# Patient Record
Sex: Female | Born: 1935
Health system: Southern US, Community
[De-identification: ages and names within clinical notes are randomized; demographics above are authoritative.]

## PROBLEM LIST (undated history)

## (undated) DIAGNOSIS — Z7901 Long term (current) use of anticoagulants: Secondary | ICD-10-CM

## (undated) DIAGNOSIS — F0390 Unspecified dementia without behavioral disturbance: Secondary | ICD-10-CM

## (undated) DIAGNOSIS — I10 Essential (primary) hypertension: Secondary | ICD-10-CM

## (undated) DIAGNOSIS — R251 Tremor, unspecified: Secondary | ICD-10-CM

## (undated) DIAGNOSIS — I2699 Other pulmonary embolism without acute cor pulmonale: Secondary | ICD-10-CM

---

## 2014-12-15 ENCOUNTER — Encounter (HOSPITAL_COMMUNITY): Payer: Self-pay | Admitting: Internal Medicine

## 2014-12-15 ENCOUNTER — Inpatient Hospital Stay (HOSPITAL_COMMUNITY)
Admission: AD | Admit: 2014-12-15 | Discharge: 2014-12-19 | DRG: 378 | Disposition: A | Discharge status: Home Health | Payer: Medicare Other | Source: Other Acute Inpatient Hospital | Attending: Family Medicine | Admitting: Family Medicine

## 2014-12-15 DIAGNOSIS — I10 Essential (primary) hypertension: Secondary | ICD-10-CM | POA: Diagnosis present

## 2014-12-15 DIAGNOSIS — Z4659 Encounter for fitting and adjustment of other gastrointestinal appliance and device: Secondary | ICD-10-CM

## 2014-12-15 DIAGNOSIS — I2699 Other pulmonary embolism without acute cor pulmonale: Secondary | ICD-10-CM | POA: Diagnosis present

## 2014-12-15 DIAGNOSIS — Z79899 Other long term (current) drug therapy: Secondary | ICD-10-CM

## 2014-12-15 DIAGNOSIS — F0390 Unspecified dementia without behavioral disturbance: Secondary | ICD-10-CM | POA: Diagnosis present

## 2014-12-15 DIAGNOSIS — Z86711 Personal history of pulmonary embolism: Secondary | ICD-10-CM

## 2014-12-15 DIAGNOSIS — R251 Tremor, unspecified: Secondary | ICD-10-CM | POA: Diagnosis present

## 2014-12-15 DIAGNOSIS — D649 Anemia, unspecified: Secondary | ICD-10-CM | POA: Diagnosis present

## 2014-12-15 DIAGNOSIS — I350 Nonrheumatic aortic (valve) stenosis: Secondary | ICD-10-CM | POA: Diagnosis present

## 2014-12-15 DIAGNOSIS — K648 Other hemorrhoids: Secondary | ICD-10-CM | POA: Diagnosis present

## 2014-12-15 DIAGNOSIS — K5731 Diverticulosis of large intestine without perforation or abscess with bleeding: Principal | ICD-10-CM | POA: Diagnosis present

## 2014-12-15 DIAGNOSIS — Z7901 Long term (current) use of anticoagulants: Secondary | ICD-10-CM | POA: Diagnosis not present

## 2014-12-15 DIAGNOSIS — F05 Delirium due to known physiological condition: Secondary | ICD-10-CM | POA: Insufficient documentation

## 2014-12-15 DIAGNOSIS — F0391 Unspecified dementia with behavioral disturbance: Secondary | ICD-10-CM | POA: Diagnosis not present

## 2014-12-15 DIAGNOSIS — D120 Benign neoplasm of cecum: Secondary | ICD-10-CM | POA: Diagnosis present

## 2014-12-15 DIAGNOSIS — K625 Hemorrhage of anus and rectum: Secondary | ICD-10-CM | POA: Diagnosis present

## 2014-12-15 HISTORY — DX: Tremor, unspecified: R25.1

## 2014-12-15 HISTORY — DX: Essential (primary) hypertension: I10

## 2014-12-15 HISTORY — DX: Long term (current) use of anticoagulants: Z79.01

## 2014-12-15 HISTORY — DX: Unspecified dementia, unspecified severity, without behavioral disturbance, psychotic disturbance, mood disturbance, and anxiety: F03.90

## 2014-12-15 HISTORY — DX: Other pulmonary embolism without acute cor pulmonale: I26.99

## 2014-12-15 LAB — COMPREHENSIVE METABOLIC PANEL
ALBUMIN: 3.2 g/dL — AB (ref 3.5–5.0)
ALT: 15 U/L (ref 14–54)
AST: 19 U/L (ref 15–41)
Alkaline Phosphatase: 53 U/L (ref 38–126)
Anion gap: 5 (ref 5–15)
BUN: 12 mg/dL (ref 6–20)
CHLORIDE: 108 mmol/L (ref 101–111)
CO2: 29 mmol/L (ref 22–32)
Calcium: 8.1 mg/dL — ABNORMAL LOW (ref 8.9–10.3)
Creatinine, Ser: 0.56 mg/dL (ref 0.44–1.00)
GFR calc Af Amer: >60 mL/min (ref >60)
GFR calc non Af Amer: >60 mL/min (ref >60)
GLUCOSE: 104 mg/dL — AB (ref 65–99)
POTASSIUM: 3.7 mmol/L (ref 3.5–5.1)
SODIUM: 142 mmol/L (ref 135–145)
Total Bilirubin: 0.5 mg/dL (ref 0.3–1.2)
Total Protein: 5.7 g/dL — ABNORMAL LOW (ref 6.5–8.1)

## 2014-12-15 LAB — CBC WITH DIFFERENTIAL/PLATELET
BASOS ABS: 0 10*3/uL (ref 0.0–0.1)
BASOS PCT: 1 % (ref 0–1)
EOS PCT: 2 % (ref 0–5)
Eosinophils Absolute: 0.1 10*3/uL (ref 0.0–0.7)
HCT: 27.2 % — ABNORMAL LOW (ref 36.0–46.0)
Hemoglobin: 8.6 g/dL — ABNORMAL LOW (ref 12.0–15.0)
Lymphocytes Relative: 20 % (ref 12–46)
Lymphs Abs: 1.4 10*3/uL (ref 0.7–4.0)
MCH: 30.7 pg (ref 26.0–34.0)
MCHC: 31.6 g/dL (ref 30.0–36.0)
MCV: 97.1 fL (ref 78.0–100.0)
MONO ABS: 0.6 10*3/uL (ref 0.1–1.0)
Monocytes Relative: 8 % (ref 3–12)
Neutro Abs: 4.9 10*3/uL (ref 1.7–7.7)
Neutrophils Relative %: 70 % (ref 43–77)
PLATELETS: 104 10*3/uL — AB (ref 150–400)
RBC: 2.8 MIL/uL — AB (ref 3.87–5.11)
RDW: 14.5 % (ref 11.5–15.5)
WBC: 7.1 10*3/uL (ref 4.0–10.5)

## 2014-12-15 LAB — RETICULOCYTES
RBC.: 2.8 MIL/uL — AB (ref 3.87–5.11)
RETIC COUNT ABSOLUTE: 11.2 10*3/uL — AB (ref 19.0–186.0)
RETIC CT PCT: 0.4 % (ref 0.4–3.1)

## 2014-12-15 LAB — HEMOGLOBIN AND HEMATOCRIT, BLOOD
HCT: 28 % — ABNORMAL LOW (ref 36.0–46.0)
HEMATOCRIT: 27.5 % — AB (ref 36.0–46.0)
Hemoglobin: 9 g/dL — ABNORMAL LOW (ref 12.0–15.0)
Hemoglobin: 8.7 g/dL — ABNORMAL LOW (ref 12.0–15.0)

## 2014-12-15 LAB — GLUCOSE, CAPILLARY: GLUCOSE-CAPILLARY: 74 mg/dL (ref 65–99)

## 2014-12-15 LAB — TYPE AND SCREEN
ABO/RH(D): O POS
Antibody Screen: NEGATIVE

## 2014-12-15 LAB — LACTIC ACID, PLASMA: Lactic Acid, Venous: 0.7 mmol/L (ref 0.5–2.0)

## 2014-12-15 LAB — PROTIME-INR
INR: 1.32 (ref 0.00–1.49)
PROTHROMBIN TIME: 16.5 s — AB (ref 11.6–15.2)

## 2014-12-15 LAB — MRSA PCR SCREENING: MRSA BY PCR: NEGATIVE

## 2014-12-15 LAB — ABO/RH: ABO/RH(D): O POS

## 2014-12-15 LAB — APTT: aPTT: 30 seconds (ref 24–37)

## 2014-12-15 MED ORDER — PANTOPRAZOLE SODIUM 40 MG PO TBEC
40.0000 mg | DELAYED_RELEASE_TABLET | Freq: Two times a day (BID) | ORAL | Status: DC
  Administered 2014-12-15 – 2014-12-16 (×3): 40 mg via ORAL
  Filled 2014-12-15 (×3): qty 1

## 2014-12-15 MED ORDER — MEMANTINE HCL 5 MG PO TABS
5.0000 mg | ORAL_TABLET | Freq: Two times a day (BID) | ORAL | Status: DC
  Administered 2014-12-15 – 2014-12-16 (×3): 5 mg via ORAL
  Filled 2014-12-15 (×5): qty 1

## 2014-12-15 MED ORDER — PRIMIDONE 250 MG PO TABS
250.0000 mg | ORAL_TABLET | Freq: Every day | ORAL | Status: DC
  Administered 2014-12-15 – 2014-12-16 (×2): 250 mg via ORAL
  Filled 2014-12-15 (×2): qty 1

## 2014-12-15 MED ORDER — SODIUM CHLORIDE 0.9 % IV SOLN
INTRAVENOUS | Status: DC
  Administered 2014-12-15: 1000 mL via INTRAVENOUS

## 2014-12-15 MED ORDER — PANTOPRAZOLE SODIUM 40 MG IV SOLR
40.0000 mg | Freq: Two times a day (BID) | INTRAVENOUS | Status: DC
  Administered 2014-12-15: 40 mg via INTRAVENOUS
  Filled 2014-12-15: qty 40

## 2014-12-15 MED ORDER — PEG 3350-KCL-NA BICARB-NACL 420 G PO SOLR
4000.0000 mL | Freq: Once | ORAL | Status: AC
  Administered 2014-12-15: 4000 mL via ORAL
  Filled 2014-12-15: qty 4000

## 2014-12-15 MED ORDER — ONDANSETRON HCL 4 MG/2ML IJ SOLN
4.0000 mg | Freq: Four times a day (QID) | INTRAMUSCULAR | Status: DC | PRN
  Administered 2014-12-16: 4 mg via INTRAVENOUS
  Filled 2014-12-15 (×2): qty 2

## 2014-12-15 MED ORDER — ACETAMINOPHEN 650 MG RE SUPP
650.0000 mg | Freq: Four times a day (QID) | RECTAL | Status: DC | PRN
  Administered 2014-12-18: 650 mg via RECTAL
  Filled 2014-12-15: qty 1

## 2014-12-15 MED ORDER — ONDANSETRON HCL 4 MG PO TABS: 4.0000 mg | ORAL_TABLET | Freq: Four times a day (QID) | ORAL | Status: DC | PRN

## 2014-12-15 MED ORDER — POLYETHYLENE GLYCOL 3350 17 GM/SCOOP PO POWD
0.5000 | Freq: Once | ORAL | Status: AC
  Administered 2014-12-16: 127.5 g via ORAL
  Filled 2014-12-15: qty 255

## 2014-12-15 MED ORDER — METOPROLOL TARTRATE 25 MG PO TABS
25.0000 mg | ORAL_TABLET | Freq: Two times a day (BID) | ORAL | Status: DC
  Administered 2014-12-16: 25 mg via ORAL
  Filled 2014-12-15: qty 1

## 2014-12-15 MED ORDER — OXYBUTYNIN CHLORIDE ER 10 MG PO TB24
10.0000 mg | ORAL_TABLET | Freq: Every day | ORAL | Status: DC
Start: 2014-12-15 — End: 2014-12-16
  Administered 2014-12-15: 10 mg via ORAL
  Filled 2014-12-15 (×2): qty 1

## 2014-12-15 MED ORDER — SIMVASTATIN 40 MG PO TABS
40.0000 mg | ORAL_TABLET | Freq: Every day | ORAL | Status: DC
  Administered 2014-12-15 – 2014-12-16 (×2): 40 mg via ORAL
  Filled 2014-12-15 (×2): qty 1

## 2014-12-15 MED ORDER — ACETAMINOPHEN 325 MG PO TABS: 650.0000 mg | ORAL_TABLET | Freq: Four times a day (QID) | ORAL | Status: DC | PRN

## 2014-12-15 MED ORDER — SODIUM CHLORIDE 0.9 % IJ SOLN
3.0000 mL | Freq: Two times a day (BID) | INTRAMUSCULAR | Status: DC
  Administered 2014-12-15 – 2014-12-18 (×5): 3 mL via INTRAVENOUS

## 2014-12-15 MED ORDER — DONEPEZIL HCL 10 MG PO TABS
5.0000 mg | ORAL_TABLET | Freq: Every day | ORAL | Status: DC
  Administered 2014-12-15 (×2): 5 mg via ORAL
  Filled 2014-12-15 (×2): qty 1

## 2014-12-15 NOTE — Consult Note (Signed)
Consultation  Referring Provider:     Reino Bellis Primary Care Physician:  No PCP Per Patient Primary Gastroenterologist:        NA Reason for Consultation:     GI bleed         HPI:   Desiree Robertson is a 81 y.o. female who was transferred from [facility] for a suspected lower GI bleed. She reports acute onset of high volume hematochezia, bright red to maroon colored blood per rectum. She endorses a few episodes yesterday. Hgb in [place] was 13s and repeat here is 8.6. No nausea or vomiting. No abdominal pain. At the time of transfer Desiree Robertson had given her 5mg  of Vitamin K and 4 units of FFP, as she is medicated with Coumadin for history of PE. Since her transfer she denies any further blood per rectum and feels well. No abdominal pain. She has never had a GI bleed. She has no prior CRC screening or colonoscopy she is aware of, no prior upper endoscopy. She feels fatigued but otherwise has no complaints this morning.   Past Medical History  Diagnosis Date  . Hypertension   . Dementia   . Tremors of nervous system   . Pulmonary embolism   . Current use of long term anticoagulation     No past surgical history on file. Patient denies  No family history on file. No CRC  Social History  Substance Use Topics  . Smoking status: Not on file  . Smokeless tobacco: Not on file  . Alcohol Use: Not on file  No tobacco use  Prior to Admission medications   Medication Sig Start Date End Date Taking? Authorizing Provider  donepezil (ARICEPT) 5 MG tablet Take 5 mg by mouth at bedtime.   Yes Historical Provider, MD  memantine (NAMENDA) 5 MG tablet Take 5 mg by mouth 2 (two) times daily.   Yes Historical Provider, MD  metoprolol tartrate (LOPRESSOR) 25 MG tablet Take 25 mg by mouth 2 (two) times daily.   Yes Historical Provider, MD  oxybutynin (DITROPAN-XL) 10 MG 24 hr tablet Take 10 mg by mouth at bedtime.   Yes Historical Provider, MD  PRIMIDONE PO Take 250 mg by mouth daily.    Yes Historical  Provider, MD  simvastatin (ZOCOR) 40 MG tablet Take 40 mg by mouth daily.   Yes Historical Provider, MD  WARFARIN SODIUM PO Take by mouth.   Yes Historical Provider, MD    Current Facility-Administered Medications  Medication Dose Route Frequency Provider Last Rate Last Dose  . 0.9 %  sodium chloride infusion   Intravenous Continuous Lavina Hamman, MD 20 mL/hr at 12/15/14 0500 1,000 mL at 12/15/14 0500  . acetaminophen (TYLENOL) tablet 650 mg  650 mg Oral Q6H PRN Lavina Hamman, MD       Or  . acetaminophen (TYLENOL) suppository 650 mg  650 mg Rectal Q6H PRN Lavina Hamman, MD      . donepezil (ARICEPT) tablet 5 mg  5 mg Oral QHS Lavina Hamman, MD   5 mg at 12/15/14 0430  . memantine (NAMENDA) tablet 5 mg  5 mg Oral BID Lavina Hamman, MD      . Derrill Memo ON 12/16/2014] metoprolol tartrate (LOPRESSOR) tablet 25 mg  25 mg Oral BID Lavina Hamman, MD      . ondansetron [facility]) tablet 4 mg  4 mg Oral Q6H PRN Lavina Hamman, MD       Or  .  ondansetron (ZOFRAN) injection 4 mg  4 mg Intravenous Q6H PRN Lavina Hamman, MD      . oxybutynin (DITROPAN-XL) 24 hr tablet 10 mg  10 mg Oral QHS Lavina Hamman, MD      . pantoprazole (PROTONIX) injection 40 mg  40 mg Intravenous Q12H Lavina Hamman, MD      . polyethylene glycol-electrolytes (NuLYTELY/GoLYTELY) solution 4,000 mL  4,000 mL Oral Once Lavina Hamman, MD      . primidone (MYSOLINE) tablet 250 mg  250 mg Oral Daily Lavina Hamman, MD      . simvastatin (ZOCOR) tablet 40 mg  40 mg Oral Daily Lavina Hamman, MD      . sodium chloride 0.9 % injection 3 mL  3 mL Intravenous Q12H Lavina Hamman, MD   3 mL at 12/15/14 0430    Allergies as of 12/14/2014  . (Not on File)     Review of Systems:    As per HPI, otherwise negative    Physical Exam:  Vital signs in last 24 hours: Temp:  [98.1 F (36.7 C)-98.3 F (36.8 C)] 98.3 F (36.8 C) (09/13 0400) Pulse Rate:  [92] 92 (09/13 0235) Resp:  [18] 18 (09/13 0235) BP: (130)/(73) 130/73 mmHg  (09/13 0235) Weight:  [117 lb 8.1 oz (53.3 kg)] 117 lb 8.1 oz (53.3 kg) (09/13 0244)   General:   Pleasant white female in NAD Head:  Normocephalic and atraumatic. Eyes:   No icterus.    Ears:  Normal auditory acuity. Neck:  Supple Lungs:  Respirations even and unlabored. Lungs clear to auscultation bilaterally.   No wheezes, crackles, or rhonchi.  Heart:  Regular rate and rhythm;  2/6 SEM Abdomen:  Soft, nondistended, nontender. Normal bowel sounds. No appreciable masses .  Rectal:  Not performed.  Msk:  Symmetrical without gross deformities.  Extremities:  Without edema. Neurologic:  Alert and  oriented x4;  grossly normal neurologically. Skin:  Intact without significant lesions or rashes. Psych:  Alert and cooperative. Normal affect.  LAB RESULTS:  Recent Labs  12/15/14 0340  WBC 7.1  HGB 8.6*  HCT 27.2*  PLT 104*   BMET  Recent Labs  12/15/14 0340  NA 142  K 3.7  CL 108  CO2 29  GLUCOSE 104*  BUN 12  CREATININE 0.56  CALCIUM 8.1*   LFT  Recent Labs  12/15/14 0340  PROT 5.7*  ALBUMIN 3.2*  AST 19  ALT 15  ALKPHOS 53  BILITOT 0.5   PT/INR  Recent Labs  12/15/14 0340  LABPROT 16.5*  INR 1.32    STUDIES: No results found.   PREVIOUS ENDOSCOPIES:            None   Impression / Plan:   81 y/o female on coumadin for history of PE, presenting overnight with what appears to have been a significant GI bleed. Hgb down from 13s overnight to 8 this morning. She reportedly had multiple episodes of painless large volume hematochezia while in [place], however upon arrival here she has had no further rectal bleeding. BUN/Cr relatively normal and no upper tract symptoms. Suspect she had a colonic source, diverticulosis, but she has had no prior CRC screening. Mass lesion, AVM are also possible. Upper tract source seems less likely at this time but agree with empiric protonix until this is sorted out. At this time I am recommending a colonoscopy to evaluate  the source of her bleed, especially given her need for  anticoagulation moving forward. If colonoscopy is negative, we can perform EGD to exclude an upper tract source.   I think she is stable to tolerate a bowel preparation at this time. If she can complete it this morning, we may be able to perform her procedure later this afternoon, otherwise can consider it for tomorrow AM if she has no further active bleeding. If she has recurrence of severe bleeding in the interim please let us know, and can expedite her procedure. Please trend H/H otherwise and transfuse to keep Hgb > 7, as needed. Please call with any questions / concerns.   Fitzgerald Cellar, MD [facility] Pager [phone]   LOS: 0 days   Renelda Loma Malayjah Otoole  12/15/2014, 7:25 AM

## 2014-12-15 NOTE — H&P (Signed)
[facility] History and Physical  Patient: Desiree Robertson  MRN: [id number]  DOB: [birth year]  DOS: the patient was seen and examined on 12/15/2014 PCP: No PCP Per Patient  Referring physician: Dr. Penny Pia Chief Complaint: Rectal bleeding  HPI: [name] is a 81 y.o. female with Past medical history of hypertension, dementia, pulmonary embolism on chronic anticoagulation, tremors. The patient is presenting with complaints of dark maroon blood per rectum without any pain ongoing since today. Patient initially presented with this complaint to [facility] where she was initially evaluated and then transferred to [facility] due to lack of GI coverage. The patient did not have any complaints of fever chills nausea or vomiting abdominal pain chest pain shortness of breath. At the time of my evaluation she did not have any further GI bleeding. She denies having any bleeding anywhere else. She denies having any prior GI bleeding. She denies any recent antibiotics use recent travel anywhere. Denies any recent change in medication. The patient initially was seen at [facility] and a CT scan was unremarkable. Due to her history of anticoagulation the patient was given 4 units of FFP as well as 5 mg of IV vitamin K. Initial hemoglobin there was 13.6.  The patient is coming from home.  At her baseline ambulates without any support And is independent for most of her ADL manages her medication on her own.  Review of Systems: as mentioned in the history of present illness.  A comprehensive review of the other systems is negative.  Past Medical History  Diagnosis Date  . Hypertension   . Dementia   . Tremors of nervous system   . Pulmonary embolism   . Current use of long term anticoagulation    No past surgical history on file. Social History:  has no tobacco, alcohol, and drug history on file.  No Known Allergies  No family history on file.  Prior to Admission  medications   Medication Sig Start Date End Date Taking? Authorizing Provider  donepezil (ARICEPT) 5 MG tablet Take 5 mg by mouth at bedtime.   Yes Historical Provider, MD  memantine (NAMENDA) 5 MG tablet Take 5 mg by mouth 2 (two) times daily.   Yes Historical Provider, MD  metoprolol tartrate (LOPRESSOR) 25 MG tablet Take 25 mg by mouth 2 (two) times daily.   Yes Historical Provider, MD  oxybutynin (DITROPAN-XL) 10 MG 24 hr tablet Take 10 mg by mouth at bedtime.   Yes Historical Provider, MD  PRIMIDONE PO Take 250 mg by mouth daily.    Yes Historical Provider, MD  simvastatin (ZOCOR) 40 MG tablet Take 40 mg by mouth daily.   Yes Historical Provider, MD  WARFARIN SODIUM PO Take by mouth.   Yes Historical Provider, MD    Physical Exam: Filed Vitals:   12/15/14 0235 12/15/14 0244 12/15/14 0400  BP: 130/73    Pulse: 92    Temp: 98.1 F (36.7 C)  98.3 F (36.8 C)  TempSrc: Oral  Oral  Resp: 18    Height:  5\' 4"  (1.626 m)   Weight:  53.3 kg (117 lb 8.1 oz)     General: Alert, Awake and Oriented to Time, Place and Person. Appear in mild distress Eyes: PERRL ENT: Oral Mucosa clear moist. Neck: no JVD Cardiovascular: S1 and S2 Present, no Murmur, Peripheral Pulses Present Respiratory: Bilateral Air entry equal and Decreased,  Clear to Auscultation, no Crackles, no wheezes Abdomen: Bowel Sound present, Soft and no tenderness  Skin: no Rash Extremities: no Pedal edema, no calf tenderness Neurologic: Grossly no focal neuro deficit.  Labs on Admission:  CBC:  Recent Labs Lab 12/15/14 0340  WBC 7.1  NEUTROABS 4.9  HGB 8.6*  HCT 27.2*  MCV 97.1  PLT PENDING    CMP     Component Value Date/Time   NA 142 12/15/2014 0340   K 3.7 12/15/2014 0340   CL 108 12/15/2014 0340   CO2 29 12/15/2014 0340   GLUCOSE 104* 12/15/2014 0340   BUN 12 12/15/2014 0340   CREATININE 0.56 12/15/2014 0340   CALCIUM 8.1* 12/15/2014 0340   PROT 5.7* 12/15/2014 0340   ALBUMIN 3.2* 12/15/2014 0340     AST 19 12/15/2014 0340   ALT 15 12/15/2014 0340   ALKPHOS 53 12/15/2014 0340   BILITOT 0.5 12/15/2014 0340   GFRNONAA >60 12/15/2014 0340   GFRAA >60 12/15/2014 0340    No results for input(s): LIPASE, AMYLASE in the last 168 hours.  No results for input(s): CKTOTAL, CKMB, CKMBINDEX, TROPONINI in the last 168 hours. BNP (last 3 results) No results for input(s): BNP in the last 8760 hours.  ProBNP (last 3 results) No results for input(s): PROBNP in the last 8760 hours.   Radiological Exams on Admission: No results found. EKG: Independently reviewed. normal sinus rhythm, nonspecific ST and T waves changes.  Assessment/Plan Principal Problem:   Rectal bleed Active Problems:   Hypertension   Dementia   Tremors of nervous system   Pulmonary embolism   Current use of long term anticoagulation   1. Rectal bleed The patient is presenting with complaints of dark maroon blood per rectum. Hemoglobin initially was 13 and currently is 8. She is hemodynamically stable. Initially the patient was discussed with the [facility] who recommended to place the patient on bowel prep is hemodynamically stable. The lead daily is ordered. The patient remains nothing by mouth except medication. We will monitor H&H every 6 hours and transfuse as needed. Protonix every 12 hours.  INR currently subtherapeutic.  2.Pulmonary embolism. Reported history of pulmonary embolism 2 years ago. No recent acute pulmonary embolism. We will continue to closely monitor off anticoagulation. Patient denies having any history of any valvular replacement.  3.Dementia. Continuing home medications.  4.Tremors. Chronic. Continuing treatment on.  5.Essential hypertension. Possible chronic diastolic dysfunction. Continue close monitoring. Also appears to have aortic stenosis. Continuing home medications.  Advance goals of care discussion: Full code as per my discussion with patient    Consults: Gastroenterology  DVT Prophylaxis: mechanical compression device Nutrition: Nothing by mouth  Family Communication: Friend  was present at bedside, opportunity was given to ask question and all questions were answered satisfactorily at the time of interview. Disposition: Admitted as inpatient, step-down unit.  Author: Berle Mull, MD [facility] Pager: [phone] 12/15/2014  If 7PM-7AM, please contact night-coverage [email or web address] Password [email or web address]

## 2014-12-15 NOTE — Progress Notes (Signed)
Patient seen and examined. Admitted after midnight secondary to GI bleed. Patient with significant drop on Hgb from 13 down to 8.6; appears to be secondary to lower GI bleed (maybe diverticulosis). Patient is on coumadin for PE. Please referred to H&P written by Dr. Posey Pronto for further info/details on admission.  Plan: -follow Hgb trend -IV protonix -follow GI rec's; most likely colonoscopy this afternoon or in am -base on findings decide on needs for EGD -continue supportive care and transfusion as needed  Barton Dubois [phone]

## 2014-12-16 ENCOUNTER — Encounter (HOSPITAL_COMMUNITY): Payer: Self-pay | Admitting: Certified Registered"

## 2014-12-16 ENCOUNTER — Inpatient Hospital Stay (HOSPITAL_COMMUNITY): Payer: Medicare Other

## 2014-12-16 ENCOUNTER — Encounter (HOSPITAL_COMMUNITY)
Admission: AD | Disposition: A | Discharge status: Home Health | Payer: Self-pay | Source: Other Acute Inpatient Hospital | Attending: Internal Medicine

## 2014-12-16 DIAGNOSIS — F0391 Unspecified dementia with behavioral disturbance: Secondary | ICD-10-CM

## 2014-12-16 DIAGNOSIS — R410 Disorientation, unspecified: Secondary | ICD-10-CM

## 2014-12-16 LAB — CBC
HEMATOCRIT: 29.7 % — AB (ref 36.0–46.0)
HEMOGLOBIN: 9.5 g/dL — AB (ref 12.0–15.0)
MCH: 30.9 pg (ref 26.0–34.0)
MCHC: 32 g/dL (ref 30.0–36.0)
MCV: 96.7 fL (ref 78.0–100.0)
Platelets: 120 10*3/uL — ABNORMAL LOW (ref 150–400)
RBC: 3.07 MIL/uL — AB (ref 3.87–5.11)
RDW: 14.1 % (ref 11.5–15.5)
WBC: 7.5 10*3/uL (ref 4.0–10.5)

## 2014-12-16 SURGERY — CANCELLED PROCEDURE

## 2014-12-16 MED ORDER — PEG-KCL-NACL-NASULF-NA ASC-C 100 G PO SOLR
0.5000 | Freq: Once | ORAL | Status: AC
  Administered 2014-12-16: 100 g via ORAL
  Filled 2014-12-16: qty 1

## 2014-12-16 MED ORDER — OXYBUTYNIN CHLORIDE 5 MG/5ML PO SYRP
10.0000 mg | ORAL_SOLUTION | Freq: Every day | ORAL | Status: DC
  Administered 2014-12-16 – 2014-12-18 (×3): 10 mg via ORAL
  Filled 2014-12-16 (×4): qty 10

## 2014-12-16 MED ORDER — QUETIAPINE FUMARATE ER 50 MG PO TB24
50.0000 mg | ORAL_TABLET | Freq: Every day | ORAL | Status: DC
  Administered 2014-12-17 – 2014-12-18 (×2): 50 mg via ORAL
  Filled 2014-12-16 (×5): qty 1

## 2014-12-16 MED ORDER — METOPROLOL TARTRATE 1 MG/ML IV SOLN
2.5000 mg | Freq: Three times a day (TID) | INTRAVENOUS | Status: DC
  Administered 2014-12-16 – 2014-12-17 (×4): 2.5 mg via INTRAVENOUS
  Filled 2014-12-16 (×5): qty 5

## 2014-12-16 MED ORDER — POLYETHYLENE GLYCOL 3350 17 GM/SCOOP PO POWD
1.0000 | Freq: Once | ORAL | Status: AC
  Administered 2014-12-16: 255 g via ORAL
  Filled 2014-12-16: qty 255

## 2014-12-16 MED ORDER — PEG-KCL-NACL-NASULF-NA ASC-C 100 G PO SOLR: 0.5000 | Freq: Once | ORAL | Status: DC

## 2014-12-16 MED ORDER — PEG-KCL-NACL-NASULF-NA ASC-C 100 G PO SOLR: 1.0000 | Freq: Once | ORAL | Status: DC

## 2014-12-16 MED ORDER — PRIMIDONE 250 MG PO TABS
250.0000 mg | ORAL_TABLET | Freq: Every day | ORAL | Status: DC
  Administered 2014-12-17 – 2014-12-19 (×3): 250 mg via NASOGASTRIC
  Filled 2014-12-16 (×3): qty 1

## 2014-12-16 MED ORDER — DONEPEZIL HCL 10 MG PO TABS
5.0000 mg | ORAL_TABLET | Freq: Every day | ORAL | Status: DC
  Administered 2014-12-16 – 2014-12-18 (×3): 5 mg via NASOGASTRIC
  Filled 2014-12-16 (×4): qty 1

## 2014-12-16 MED ORDER — PANTOPRAZOLE SODIUM 40 MG IV SOLR: 40.0000 mg | INTRAVENOUS | Status: DC

## 2014-12-16 MED ORDER — SIMVASTATIN 40 MG PO TABS
40.0000 mg | ORAL_TABLET | Freq: Every day | ORAL | Status: DC
  Administered 2014-12-18 – 2014-12-19 (×2): 40 mg via NASOGASTRIC
  Filled 2014-12-16 (×2): qty 1

## 2014-12-16 MED ORDER — PANTOPRAZOLE SODIUM 40 MG PO PACK
40.0000 mg | PACK | Freq: Every day | ORAL | Status: DC
  Administered 2014-12-17: 40 mg
  Filled 2014-12-16 (×2): qty 20

## 2014-12-16 MED ORDER — MEMANTINE HCL 5 MG PO TABS
5.0000 mg | ORAL_TABLET | Freq: Two times a day (BID) | ORAL | Status: DC
  Administered 2014-12-17 – 2014-12-19 (×4): 5 mg via NASOGASTRIC
  Filled 2014-12-16 (×6): qty 1

## 2014-12-16 NOTE — Progress Notes (Signed)
Pt remains confused and is becoming slightly belligerent.  She refuses to finish her bowel prep despite repeated pleas from Nursing staff; bedside private sitter Pamala Hurry; and daughter (called by Pamala Hurry).  Nursing staff will continue to try to convince pt to ingest needed prep.  Raliegh Ip Schorr notified via telephone.

## 2014-12-16 NOTE — Progress Notes (Signed)
[facility] PROGRESS NOTE  Cambell Rickenbach BMW:[id number] DOB: 1925-05-30 DOA: 12/15/2014 PCP: No PCP Per Patient  Assessment/Plan: 1-rectal bleed: combination of BRBPR and maroon blood -has remained hemodynamically stable -Hgb 9.5 -no transfusion needed -will continue protonix -GI recommended colonoscopy; will attempt NGT placement for prep -follow Hgb trend -patient refusing bowel prep due to acute delirium from underlying dementia  2-acute hospital acquired delirium -due to underlying dementia -will provide supportive care -constant reorientation -will add seroquel -family will stay with patient -will discharge as soon as safely possible  3-hx of PE -2 years ago -no signs of acute issues -continue holding anticoagulation -will discussed favoring stopping anticoagulation at discharge  4-dementia: will continue aricept and namenda  5-essential HTN: will continue metoprolol -will use IV if need or trouble swallowing with NGT placement  Code Status: Full Family Communication: Son Dawsyn Zurn by phone [phone]) Disposition Plan: remains inpatient and in stepdown.   Consultants:  GI   Procedures:  Colonoscopy: planned for today (9/14), but canceled due to lack of preparation   Antibiotics:  None   HPI/Subjective: Afebrile, no CPR or SOB. Patient pleasantly confused on exam. Became really agitated and ended requiring soft restrains. She has refused bowel prep. Per nursing staff still with some blood oozing. VSS  Objective: Filed Vitals:   12/16/14 1205  BP: 146/79  Pulse: 73  Temp: 98.3 F (36.8 C)  Resp: 18    Intake/Output Summary (Last 24 hours) at 12/16/14 1653 Last data filed at 12/16/14 0400  Gross per 24 hour  Intake    220 ml  Output    350 ml  Net   -130 ml   Filed Weights   12/15/14 0244  Weight: 53.3 kg (117 lb 8.1 oz)    Exam:   General:  Pleasantly confused, afebrile, no complaining of CP, abd pain or SOB. Overnight with  acute episode of agitation and need for soft restrains   Cardiovascular: S1 and S2, no rubs or gallops  Respiratory: CTA bilaterally, no use of accessory muscles  Abdomen: soft, NT, ND, positive BS  Musculoskeletal: no edema, no cyanosis or clubbing   Data Reviewed: Basic Metabolic Panel:  Recent Labs Lab 12/15/14 0340  NA 142  K 3.7  CL 108  CO2 29  GLUCOSE 104*  BUN 12  CREATININE 0.56  CALCIUM 8.1*   Liver Function Tests:  Recent Labs Lab 12/15/14 0340  AST 19  ALT 15  ALKPHOS 53  BILITOT 0.5  PROT 5.7*  ALBUMIN 3.2*   CBC:  Recent Labs Lab 12/15/14 0340 12/15/14 1000 12/15/14 1542 12/16/14 0230  WBC 7.1  --   --  7.5  NEUTROABS 4.9  --   --   --   HGB 8.6* 9.0* 8.7* 9.5*  HCT 27.2* 28.0* 27.5* 29.7*  MCV 97.1  --   --  96.7  PLT 104*  --   --  120*   CBG:  Recent Labs Lab 12/15/14 1748  GLUCAP 74    Recent Results (from the past 240 hour(s))  MRSA PCR Screening     Status: None   Collection Time: 12/15/14  2:36 AM  Result Value Ref Range Status   MRSA by PCR NEGATIVE NEGATIVE Final    Comment:        The GeneXpert MRSA Assay (FDA approved for NASAL specimens only), is one component of a comprehensive MRSA colonization surveillance program. It is not intended to diagnose MRSA infection nor to guide or monitor treatment for MRSA  infections.      Studies: No results found.  Scheduled Meds: . donepezil  5 mg Oral QHS  . memantine  5 mg Oral BID  . metoprolol tartrate  25 mg Oral BID  . oxybutynin  10 mg Oral QHS  . pantoprazole  40 mg Oral BID  . primidone  250 mg Oral Daily  . simvastatin  40 mg Oral Daily  . sodium chloride  3 mL Intravenous Q12H   Continuous Infusions: . sodium chloride 1,000 mL (12/15/14 0500)    Principal Problem:   Rectal bleed Active Problems:   Hypertension   Dementia   Tremors of nervous system   Pulmonary embolism   Current use of long term anticoagulation    Time spent: 45 minutes  (50% time dedicated to face to face evaluation, plan of care discussion and update to family, and discussion with other specialist attending)    Barton Dubois  [facility] Pager [phone]. If 7PM-7AM, please contact night-coverage at [email or web address], password [email or web address] 12/16/2014, 4:53 PM  LOS: 1 day

## 2014-12-16 NOTE — Progress Notes (Signed)
Pt again removed all telemetry leads and attempted to d/c IVs despite Kerlix bandages covering access sites.  Leads replaced again.  Nursing staff will continue to monitor.

## 2014-12-16 NOTE — Progress Notes (Signed)
Patient refused CPAP.  Patient aware to ask RN to call Respiratory if she changes her mind. 

## 2014-12-16 NOTE — Progress Notes (Signed)
Pt again removed all telemetry leads and made another attempt to remove IV access.  K Schorr paged.  Awaiting response.

## 2014-12-16 NOTE — Progress Notes (Signed)
12/16/2014 Patient at 1537 c/o dizziness, Dr Dyann Kief was made aware. [facility] RN.

## 2014-12-16 NOTE — Progress Notes (Signed)
      Progress Note   Subjective  Patient did not tolerate prep last night, became confused and did not drink it. Family here with her today and discussed the situation. She denies any abdominal pain. Hemodynamically stable.    Objective   Vital signs in last 24 hours: Temp:  [97.7 F (36.5 C)-98.3 F (36.8 C)] 97.7 F (36.5 C) (09/14 1600) Pulse Rate:  [63-99] 71 (09/14 1800) Resp:  [15-22] 20 (09/14 1800) BP: (141-175)/(63-130) 154/63 mmHg (09/14 1800) SpO2:  [95 %-96 %] 95 % (09/14 1600) Last BM Date: 12/14/14 General:    white female in NAD Heart:  Regular rate and rhythm Lungs: Respirations even and unlabored, lungs CTA bilaterally Abdomen:  Soft, nontender and nondistended. Normal bowel sounds. Extremities:  Without edema. Neurologic:  Alert and oriented,  grossly normal neurologically. Psych:  Cooperative. Normal mood and affect.  Intake/Output from previous day: 09/13 0701 - 09/14 0700 In: 440 [P.O.:180; I.V.:260] Out: 2350 [Urine:2350] Intake/Output this shift:    Lab Results:  Recent Labs  12/15/14 0340 12/15/14 1000 12/15/14 1542 12/16/14 0230  WBC 7.1  --   --  7.5  HGB 8.6* 9.0* 8.7* 9.5*  HCT 27.2* 28.0* 27.5* 29.7*  PLT 104*  --   --  120*   BMET  Recent Labs  12/15/14 0340  NA 142  K 3.7  CL 108  CO2 29  GLUCOSE 104*  BUN 12  CREATININE 0.56  CALCIUM 8.1*   LFT  Recent Labs  12/15/14 0340  PROT 5.7*  ALBUMIN 3.2*  AST 19  ALT 15  ALKPHOS 53  BILITOT 0.5   PT/INR  Recent Labs  12/15/14 0340  LABPROT 16.5*  INR 1.32    Studies/Results: No results found.     Assessment / Plan:   81 y/o female on coumadin for history of PE, presented with what appears to have been a significant GI bleed. Hgb down from 13s overnight to 9s this morning. She reportedly had multiple episodes of painless large volume hematochezia while in [place]. BUN/Cr relatively normal and no upper tract symptoms. Suspect she had a colonic source,  diverticulosis, but she has had no prior CRC screening we aware of. Mass lesion, AVM are also possible. Upper tract source seems less likely at this time.   She appears to have developed some hospital associated delirium and would not drink the bowel prep, becoming confused. Discussed with family. I am concerned about discharge home on coumadin without evaluating this given she appears to have had a significant bleed, and risk for future bleeding on coumadin without clarifying the cause. Family agreed to NG placement and bowel preparation tonight. Will plan on colonoscopy tomorrow if she tolerates the prep. Further recommendations pending the results.  If colonoscopy is negative, we can perform EGD to exclude an upper tract source but I doubt this will be the case.   Please trend H/H otherwise and transfuse to keep Hgb > 7, as needed. Please call with any questions / concerns.   [place] Cellar, MD [facility] Pager [phone]  Principal Problem:   Rectal bleed Active Problems:   Hypertension   Dementia   Tremors of nervous system   Pulmonary embolism   Current use of long term anticoagulation     LOS: 1 day   Renelda Loma Sherman Donaldson  12/16/2014, 7:41 PM

## 2014-12-16 NOTE — Progress Notes (Addendum)
Pt removed all telemetry leads & tried to pull out IV lines while trying to get OOB.  Lead stickers replaced and pt back in bed.  Pt is unable to understand safety instructions.  Bed alarm on.  Nursing staff will continue to monitor.

## 2014-12-16 NOTE — Progress Notes (Signed)
Pt again removed all telemetry leads and made another attempt to remove IV access.  Pt unable to understand safety instructions.  Sitter at [facility] unable to prevent removal of therapeutic equipment.  Nursing staff will continue to monitor.

## 2014-12-17 ENCOUNTER — Encounter (HOSPITAL_COMMUNITY): Payer: Self-pay | Admitting: *Deleted

## 2014-12-17 ENCOUNTER — Inpatient Hospital Stay (HOSPITAL_COMMUNITY): Payer: Medicare Other

## 2014-12-17 ENCOUNTER — Encounter (HOSPITAL_COMMUNITY)
Admission: AD | Disposition: A | Discharge status: Home Health | Payer: Self-pay | Source: Other Acute Inpatient Hospital | Attending: Internal Medicine

## 2014-12-17 DIAGNOSIS — F05 Delirium due to known physiological condition: Secondary | ICD-10-CM

## 2014-12-17 HISTORY — PX: COLONOSCOPY: SHX5424

## 2014-12-17 LAB — CBC
HCT: 36 % (ref 36.0–46.0)
HEMOGLOBIN: 11.8 g/dL — AB (ref 12.0–15.0)
MCH: 31 pg (ref 26.0–34.0)
MCHC: 32.8 g/dL (ref 30.0–36.0)
MCV: 94.5 fL (ref 78.0–100.0)
PLATELETS: 173 10*3/uL (ref 150–400)
RBC: 3.81 MIL/uL — AB (ref 3.87–5.11)
RDW: 13.4 % (ref 11.5–15.5)
WBC: 10.4 10*3/uL (ref 4.0–10.5)

## 2014-12-17 SURGERY — COLONOSCOPY
Anesthesia: Moderate Sedation

## 2014-12-17 MED ORDER — LORAZEPAM 2 MG/ML IJ SOLN
0.5000 mg | Freq: Once | INTRAMUSCULAR | Status: AC
  Administered 2014-12-17: 0.5 mg via INTRAVENOUS
  Filled 2014-12-17: qty 1

## 2014-12-17 MED ORDER — SODIUM CHLORIDE 0.9 % IV BOLUS (SEPSIS)
250.0000 mL | Freq: Once | INTRAVENOUS | Status: AC
  Administered 2014-12-17: 250 mL via INTRAVENOUS

## 2014-12-17 MED ORDER — MIDAZOLAM HCL 5 MG/ML IJ SOLN
INTRAMUSCULAR | Status: AC
  Filled 2014-12-17: qty 2

## 2014-12-17 MED ORDER — DIPHENHYDRAMINE HCL 50 MG/ML IJ SOLN
INTRAMUSCULAR | Status: AC
  Filled 2014-12-17: qty 1

## 2014-12-17 MED ORDER — FENTANYL CITRATE (PF) 100 MCG/2ML IJ SOLN
INTRAMUSCULAR | Status: DC | PRN
  Administered 2014-12-17: 25 ug via INTRAVENOUS

## 2014-12-17 MED ORDER — FENTANYL CITRATE (PF) 100 MCG/2ML IJ SOLN
INTRAMUSCULAR | Status: AC
  Filled 2014-12-17: qty 2

## 2014-12-17 MED ORDER — SODIUM CHLORIDE 0.9 % IV SOLN
INTRAVENOUS | Status: DC
  Administered 2014-12-17: 500 mL via INTRAVENOUS

## 2014-12-17 MED ORDER — MIDAZOLAM HCL 10 MG/2ML IJ SOLN
INTRAMUSCULAR | Status: DC | PRN
  Administered 2014-12-17: 1 mg via INTRAVENOUS

## 2014-12-17 NOTE — Progress Notes (Addendum)
Pt received 2200 scheduled dose of metoprolol. Pt currently with BP of 107/37 MAP of 55 and HR 58 SB. NP on call notified of pt condition and orders received. Will continue to monitor.

## 2014-12-17 NOTE — Clinical Documentation Improvement (Deleted)
Hospitalist  Please document in the progress notes (not on the query form itself) if a condition below provides greater specificity regarding the patient's "acute hospital acquired delirium - due to underlying dementia":   - Dementia with a behavioral disturbance  - Other Condition  - Unable to clinically determine   Clinical Information: "acute hospital acquired delirium -due to underlying dementia" is documented by Dr. Dyann Kief on 12/16/14. Soft restraints were required per progress notes.    Please exercise your independent, professional judgment when responding. A specific answer is not anticipated or expected.   Thank You, Erling Conte  RN BSN [place] [phone]

## 2014-12-17 NOTE — Op Note (Signed)
[facility] [place], [address]   COLONOSCOPY PROCEDURE REPORT  PATIENT: Desiree Robertson, Desiree Robertson  MR#: [id number] BIRTHDATE: [birth year] , 72  yrs. old GENDER: female ENDOSCOPIST: [place] Cellar, MD REFERRED BY: PROCEDURE DATE:  12/17/2014 PROCEDURE:   Colonoscopy, diagnostic  ASA CLASS:   Class III INDICATIONS:hematochezia. MEDICATIONS: Midazolam (Versed) 1 mg IV and Fentanyl 25 mcg IV  DESCRIPTION OF PROCEDURE:   After the risks benefits and alternatives of the procedure were thoroughly explained, informed consent was obtained.  The digital rectal exam revealed no abnormalities of the rectum.   The Pentax Ped Colon [id number] endoscope was introduced through the anus and advanced to the cecum, which was identified by both the appendix and ileocecal valve. No adverse events experienced.   The quality of the prep was fair.  The instrument was then slowly withdrawn as the colon was fully examined. Estimated blood loss is zero unless otherwise noted in this procedure report.      COLON FINDINGS: Severe diverticulosis was noted in the left colon and mild in the distal transverse colon.  No active bleeding was noted.  The bowel prep was only fair in general and poor in the right colon.  A < 1 cm polyp was noted in the cecum but not removed given recent bleeding and poor prep in the area.  Poor prep prohibited intubation of the ileum.  Large internal hemorrhoids were noted on retroflexion without evidence of active bleeding.  Overall, no active bleeding noted on this procedure. Technically challenging intubation given severe diverticulosis. Retroflexed views revealed internal hemorrhoids. The time to cecum = 9.7 Withdrawal time = 12.1   The scope was withdrawn and the procedure completed. COMPLICATIONS: There were no immediate complications.  ENDOSCOPIC IMPRESSION: Severe left sided diverticulosis Cecal polyp not removed as described above Large  internal hemorrhoids No active GI bleeding at this time. Suspect she most likely had diverticular bleeding vs. less likely hemorrhoidal bleeding to cause this presentation  RECOMMENDATIONS: Return to ward Resume regular diet Resume medications. If the patient needs anticoagulation, okay to resume at this time If patient has rebleeding, consider tagged RBC scan to localize the source or repeat colonoscopy at time of active bleeding Please call with questions / concerns  eSigned:   Cellar, MD 12/17/2014 11:12 AM   cc:   PATIENT NAME:  Desiree Robertson, Desiree Robertson MR#: [id number]

## 2014-12-17 NOTE — Progress Notes (Signed)
Met w pt and son Desiree Robertson [phone] in room. Pt awaits pt/ot eval. Pt lives at home w sister who is 72yr older w dementia who still drives. They have couple that helps w cleaning,cooking,yard work and driving for them also. Hx of snf after hosp. Interested in hhc vs snf dep on rec of phy there. Son lives about 1 hr 113m from pt and da also lives out of town. Will await phy there eval and proceed w their recom.

## 2014-12-17 NOTE — Interval H&P Note (Signed)
History and Physical Interval Note:  12/17/2014 10:38 AM  Desiree Robertson  has presented today for surgery, with the diagnosis of lower GI bleed  The various methods of treatment have been discussed with the patient and family. After consideration of risks, benefits and other options for treatment, the patient has consented to  Procedure(s): COLONOSCOPY (N/A) as a surgical intervention .  The patient's history has been reviewed, patient examined, no change in status, stable for surgery.  I have reviewed the patient's chart and labs.  Questions were answered to the patient's satisfaction.     Renelda Loma Devanta Daniel

## 2014-12-17 NOTE — Progress Notes (Signed)
Patient refuses CPAP, VSS and NAD noted

## 2014-12-17 NOTE — H&P (View-Only) (Signed)
      Progress Note   Subjective  Patient did not tolerate prep last night, became confused and did not drink it. Family here with her today and discussed the situation. She denies any abdominal pain. Hemodynamically stable.    Objective   Vital signs in last 24 hours: Temp:  [97.7 F (36.5 C)-98.3 F (36.8 C)] 97.7 F (36.5 C) (09/14 1600) Pulse Rate:  [63-99] 71 (09/14 1800) Resp:  [15-22] 20 (09/14 1800) BP: (141-175)/(63-130) 154/63 mmHg (09/14 1800) SpO2:  [95 %-96 %] 95 % (09/14 1600) Last BM Date: 12/14/14 General:    white female in NAD Heart:  Regular rate and rhythm Lungs: Respirations even and unlabored, lungs CTA bilaterally Abdomen:  Soft, nontender and nondistended. Normal bowel sounds. Extremities:  Without edema. Neurologic:  Alert and oriented,  grossly normal neurologically. Psych:  Cooperative. Normal mood and affect.  Intake/Output from previous day: 09/13 0701 - 09/14 0700 In: 440 [P.O.:180; I.V.:260] Out: 2350 [Urine:2350] Intake/Output this shift:    Lab Results:  Recent Labs  12/15/14 0340 12/15/14 1000 12/15/14 1542 12/16/14 0230  WBC 7.1  --   --  7.5  HGB 8.6* 9.0* 8.7* 9.5*  HCT 27.2* 28.0* 27.5* 29.7*  PLT 104*  --   --  120*   BMET  Recent Labs  12/15/14 0340  NA 142  K 3.7  CL 108  CO2 29  GLUCOSE 104*  BUN 12  CREATININE 0.56  CALCIUM 8.1*   LFT  Recent Labs  12/15/14 0340  PROT 5.7*  ALBUMIN 3.2*  AST 19  ALT 15  ALKPHOS 53  BILITOT 0.5   PT/INR  Recent Labs  12/15/14 0340  LABPROT 16.5*  INR 1.32    Studies/Results: No results found.     Assessment / Plan:   81 y/o female on coumadin for history of PE, presented with what appears to have been a significant GI bleed. Hgb down from 13s overnight to 9s this morning. She reportedly had multiple episodes of painless large volume hematochezia while in [place]. BUN/Cr relatively normal and no upper tract symptoms. Suspect she had a colonic source,  diverticulosis, but she has had no prior CRC screening we aware of. Mass lesion, AVM are also possible. Upper tract source seems less likely at this time.   She appears to have developed some hospital associated delirium and would not drink the bowel prep, becoming confused. Discussed with family. I am concerned about discharge home on coumadin without evaluating this given she appears to have had a significant bleed, and risk for future bleeding on coumadin without clarifying the cause. Family agreed to NG placement and bowel preparation tonight. Will plan on colonoscopy tomorrow if she tolerates the prep. Further recommendations pending the results.  If colonoscopy is negative, we can perform EGD to exclude an upper tract source but I doubt this will be the case.   Please trend H/H otherwise and transfuse to keep Hgb > 7, as needed. Please call with any questions / concerns.   Desiree Cellar, MD [facility] Pager [phone]  Principal Problem:   Rectal bleed Active Problems:   Hypertension   Dementia   Tremors of nervous system   Pulmonary embolism   Current use of long term anticoagulation     LOS: 1 day   Desiree Robertson  12/16/2014, 7:41 PM

## 2014-12-17 NOTE — Progress Notes (Signed)
[facility] PROGRESS NOTE  Desiree Robertson OZH:[id number] DOB: 01-16-1926 DOA: 12/15/2014 PCP: No PCP Per Patient  Assessment/Plan: 1-rectal bleed: combination of BRBPR and maroon blood prior to admission. -has remained hemodynamically stable -Hgb 11.8 -no transfusion needed -will continue protonix -GI will performed colonoscopy this am; tolerated preparation with NGT  -Hgb continue trending up and bleedings has pretty much subsided completely  2-acute hospital acquired delirium -due to underlying dementia -will provide supportive care -constant reorientation -will continue using seroquel -family at bedside helping  -will discharge as soon as safely possible  3-hx of PE -2 years ago approx/ -no signs of acute issues -continue holding anticoagulation  -will discussed favoring stopping anticoagulation at discharge  4-dementia: will continue aricept and namenda  5-essential HTN: will continue metoprolol  6-physical deconditioning: will ask PT/OT to evaluate on patient.  Code Status: Full Family Communication: Son Desiree Robertson by phone [phone]) Disposition Plan: remains inpatient for now, follow colonoscopy results. Will have PT/OT evaluating on patient.   Consultants:  GI   Procedures:  Colonoscopy: planned for today (9/15)  Antibiotics:  None   HPI/Subjective: Afebrile, no CPR or SOB. Patient pleasantly confused on exam but calm. Hgb continue to trend up. Better bowel prep overnight.  Objective: Filed Vitals:   12/17/14 0928  BP: 122/80  Pulse: 74  Temp: 98 F (36.7 C)  Resp: 20    Intake/Output Summary (Last 24 hours) at 12/17/14 1009 Last data filed at 12/17/14 0832  Gross per 24 hour  Intake    740 ml  Output   1400 ml  Net   -660 ml   Filed Weights   12/15/14 0244  Weight: 53.3 kg (117 lb 8.1 oz)    Exam:   General:  Pleasantly confused, no complaining of CP, abd pain or SOB. Overnight with better bowel prep and tolerated well NGT  overall. Still was in need of soft restrains.  Cardiovascular: S1 and S2, no rubs or gallops  Respiratory: CTA bilaterally, no use of accessory muscles  Abdomen: soft, NT, ND, positive BS  Musculoskeletal: no edema, no cyanosis or clubbing   Data Reviewed: Basic Metabolic Panel:  Recent Labs Lab 12/15/14 0340  NA 142  K 3.7  CL 108  CO2 29  GLUCOSE 104*  BUN 12  CREATININE 0.56  CALCIUM 8.1*   Liver Function Tests:  Recent Labs Lab 12/15/14 0340  AST 19  ALT 15  ALKPHOS 53  BILITOT 0.5  PROT 5.7*  ALBUMIN 3.2*   CBC:  Recent Labs Lab 12/15/14 0340 12/15/14 1000 12/15/14 1542 12/16/14 0230 12/17/14 0258  WBC 7.1  --   --  7.5 10.4  NEUTROABS 4.9  --   --   --   --   HGB 8.6* 9.0* 8.7* 9.5* 11.8*  HCT 27.2* 28.0* 27.5* 29.7* 36.0  MCV 97.1  --   --  96.7 94.5  PLT 104*  --   --  120* 173   CBG:  Recent Labs Lab 12/15/14 1748  GLUCAP 74    Recent Results (from the past 240 hour(s))  MRSA PCR Screening     Status: None   Collection Time: 12/15/14  2:36 AM  Result Value Ref Range Status   MRSA by PCR NEGATIVE NEGATIVE Final    Comment:        The GeneXpert MRSA Assay (FDA approved for NASAL specimens only), is one component of a comprehensive MRSA colonization surveillance program. It is not intended to diagnose MRSA infection nor to  guide or monitor treatment for MRSA infections.      Studies: Dg Abd Portable 1v  12/17/2014   CLINICAL DATA:  NG tube placement.  EXAM: PORTABLE ABDOMEN - 1 VIEW  COMPARISON:  One day prior  FINDINGS: Nasogastric tube is looped in the stomach, with the tip at day gastric body. Cardiomegaly. Convex right thoracolumbar spine curvature. Non-obstructive bowel gas pattern. Pelvis excluded. Advanced aortic atherosclerosis.  IMPRESSION: Nasogastric terminating at the body of the stomach.   Electronically Signed   By: Abigail Miyamoto M.D.   On: 12/17/2014 08:08   Dg Abd Portable 1v  12/16/2014   CLINICAL DATA:   Nasogastric tube placement.  Rectal bleeding.  EXAM: PORTABLE ABDOMEN - 1 VIEW  COMPARISON:  None.  FINDINGS: Nasogastric tube is seen with tip overlying the body the stomach. No evidence of dilated bowel loops or bowel obstruction. Atherosclerotic calcification of abdominal aorta and iliac arteries noted  IMPRESSION: Nasogastric tube tip overlies the body of the stomach.   Electronically Signed   By: Earle Gell M.D.   On: 12/16/2014 20:40    Scheduled Meds: . [MAR Hold] donepezil  5 mg Per NG tube QHS  . [MAR Hold] memantine  5 mg Per NG tube BID  . [MAR Hold] metoprolol  2.5 mg Intravenous 3 times per day  . [MAR Hold] oxybutynin  10 mg Oral QHS  . [MAR Hold] pantoprazole sodium  40 mg Per Tube Daily  . peg 3350 powder  0.5 kit Oral Once  . [MAR Hold] primidone  250 mg Per NG tube Daily  . [MAR Hold] QUEtiapine  50 mg Oral QHS  . [MAR Hold] simvastatin  40 mg Per NG tube Daily  . [MAR Hold] sodium chloride  3 mL Intravenous Q12H   Continuous Infusions: . sodium chloride 1,000 mL (12/15/14 0500)  . sodium chloride 500 mL (12/17/14 0940)    Principal Problem:   Rectal bleed Active Problems:   Hypertension   Dementia   Tremors of nervous system   Pulmonary embolism   Current use of long term anticoagulation    Time spent: 45 minutes (50% time dedicated to face to face evaluation, plan of care discussion and update to family, and discussion with other specialist attending)    Barton Dubois  [facility] Pager [phone]. If 7PM-7AM, please contact night-coverage at [email or web address], password [facility] 12/17/2014, 10:09 AM  LOS: 2 days

## 2014-12-18 ENCOUNTER — Encounter (HOSPITAL_COMMUNITY): Payer: Self-pay | Admitting: Gastroenterology

## 2014-12-18 MED ORDER — SODIUM CHLORIDE 0.9 % IV BOLUS (SEPSIS)
250.0000 mL | Freq: Once | INTRAVENOUS | Status: AC
  Administered 2014-12-18: 250 mL via INTRAVENOUS

## 2014-12-18 MED ORDER — PANTOPRAZOLE SODIUM 40 MG PO TBEC
40.0000 mg | DELAYED_RELEASE_TABLET | Freq: Every day | ORAL | Status: DC
Start: 2014-12-18 — End: 2014-12-19
  Administered 2014-12-18 – 2014-12-19 (×2): 40 mg via ORAL
  Filled 2014-12-18 (×2): qty 1

## 2014-12-18 NOTE — Progress Notes (Signed)
[facility] PROGRESS NOTE  Desiree Robertson OIZ:[id number] DOB: 1926/03/16 DOA: 12/15/2014 PCP: No PCP Per Patient  Assessment/Plan: 1-rectal bleed: combination of BRBPR and maroon blood prior to admission. -has remained hemodynamically stable -Hgb 11.8 -no transfusion needed -will continue protonix -GI will performed colonoscopy: Results reviewed and reporting large internal hemorrhoids, cecal polyp, severe left-sided diverticulosis. Currently suspecting diverticular bleed versus bleed from hemorrhoid. No active bleeding on evaluation and GI has recommended continuing anticoagulation.  2-acute hospital acquired delirium -Secondary to underlying dementia -will provide supportive care -constant reorientation -will continue using seroquel -family at bedside helping  -will discharge as soon as safely possible  3-hx of PE -2 years ago approx/ -no signs of acute issues -continue anticoagulation   4-dementia: will continue aricept and namenda  5-essential HTN: will continue metoprolol  6-physical deconditioning: will ask PT/OT to evaluate on patient.  Code Status: Full Family Communication: Called son but went to voice message Disposition Plan: remains inpatient for now, follow colonoscopy results. Will have PT/OT evaluating on patient. Most likely DC next a.m.   Consultants:  GI   Procedures:  Colonoscopy (9/15)  Antibiotics:  None   HPI/Subjective: The patient has no new complaints.  Objective: Filed Vitals:   12/18/14 0800  BP: 136/54  Pulse: 62  Temp: 98.1 F (36.7 C)  Resp: 17    Intake/Output Summary (Last 24 hours) at 12/18/14 1108 Last data filed at 12/18/14 0800  Gross per 24 hour  Intake   1265 ml  Output    450 ml  Net    815 ml   Filed Weights   12/15/14 0244 12/18/14 0522  Weight: 53.3 kg (117 lb 8.1 oz) 49.7 kg (109 lb 9.1 oz)    Exam:   General:  Pleasantly confused, in no acute distress, alert and awake  Cardiovascular: S1 and  S2, no rubs or gallops  Respiratory: CTA bilaterally, no use of accessory muscles  Abdomen: soft, NT, ND, positive BS  Musculoskeletal: no edema, no cyanosis or clubbing   Data Reviewed: Basic Metabolic Panel:  Recent Labs Lab 12/15/14 0340  NA 142  K 3.7  CL 108  CO2 29  GLUCOSE 104*  BUN 12  CREATININE 0.56  CALCIUM 8.1*   Liver Function Tests:  Recent Labs Lab 12/15/14 0340  AST 19  ALT 15  ALKPHOS 53  BILITOT 0.5  PROT 5.7*  ALBUMIN 3.2*   CBC:  Recent Labs Lab 12/15/14 0340 12/15/14 1000 12/15/14 1542 12/16/14 0230 12/17/14 0258  WBC 7.1  --   --  7.5 10.4  NEUTROABS 4.9  --   --   --   --   HGB 8.6* 9.0* 8.7* 9.5* 11.8*  HCT 27.2* 28.0* 27.5* 29.7* 36.0  MCV 97.1  --   --  96.7 94.5  PLT 104*  --   --  120* 173   CBG:  Recent Labs Lab 12/15/14 1748  GLUCAP 74    Recent Results (from the past 240 hour(s))  MRSA PCR Screening     Status: None   Collection Time: 12/15/14  2:36 AM  Result Value Ref Range Status   MRSA by PCR NEGATIVE NEGATIVE Final    Comment:        The GeneXpert MRSA Assay (FDA approved for NASAL specimens only), is one component of a comprehensive MRSA colonization surveillance program. It is not intended to diagnose MRSA infection nor to guide or monitor treatment for MRSA infections.      Studies: Dg Abd Portable  1v  12/17/2014   CLINICAL DATA:  NG tube placement.  EXAM: PORTABLE ABDOMEN - 1 VIEW  COMPARISON:  One day prior  FINDINGS: Nasogastric tube is looped in the stomach, with the tip at day gastric body. Cardiomegaly. Convex right thoracolumbar spine curvature. Non-obstructive bowel gas pattern. Pelvis excluded. Advanced aortic atherosclerosis.  IMPRESSION: Nasogastric terminating at the body of the stomach.   Electronically Signed   By: Abigail Miyamoto M.D.   On: 12/17/2014 08:08   Dg Abd Portable 1v  12/16/2014   CLINICAL DATA:  Nasogastric tube placement.  Rectal bleeding.  EXAM: PORTABLE ABDOMEN - 1 VIEW   COMPARISON:  None.  FINDINGS: Nasogastric tube is seen with tip overlying the body the stomach. No evidence of dilated bowel loops or bowel obstruction. Atherosclerotic calcification of abdominal aorta and iliac arteries noted  IMPRESSION: Nasogastric tube tip overlies the body of the stomach.   Electronically Signed   By: Earle Gell M.D.   On: 12/16/2014 20:40    Scheduled Meds: . donepezil  5 mg Per NG tube QHS  . memantine  5 mg Per NG tube BID  . metoprolol  2.5 mg Intravenous 3 times per day  . oxybutynin  10 mg Oral QHS  . pantoprazole  40 mg Oral Daily  . peg 3350 powder  0.5 kit Oral Once  . primidone  250 mg Per NG tube Daily  . QUEtiapine  50 mg Oral QHS  . simvastatin  40 mg Per NG tube Daily  . sodium chloride  3 mL Intravenous Q12H   Continuous Infusions: . sodium chloride 1,000 mL (12/15/14 0500)    Principal Problem:   Rectal bleed Active Problems:   Hypertension   Dementia   Tremors of nervous system   Pulmonary embolism   Current use of long term anticoagulation   Delirium due to medical condition without behavioral disturbance    Time spent: > 35 minutes    Velvet Bathe  [facility] Pager [phone] If 7PM-7AM, please contact night-coverage at [email or web address], password [facility] 12/18/2014, 11:08 AM  LOS: 3 days

## 2014-12-18 NOTE — Evaluation (Signed)
Physical Therapy Evaluation Patient Details Name: Desiree Robertson MRN: [id number] DOB: October 10, 1925 Today's Date: 12/18/2014   History of Present Illness  Desiree Robertson is a 81 y.o. female with Past medical history of hypertension, dementia, pulmonary embolism on chronic anticoagulation, tremors admitted with rectal bleeding with colonoscopy showing no active bleeding. Hgb on admission 8.6 now 11.8.  Clinical Impression  Patient seated in recliner and agreeable to participate in PT today. She presents with deficits listed below, and was able to ambulate and transfer as described below. See Vitals below to see pertinent vitals during session. Patient will benefit from continued PT while in the hospital to improve strength and ambulatory endurance, as well as address stairs as her HR response allows, in order to safely discharge to the recommended venue below.    Follow Up Recommendations SNF;Supervision - Intermittent;Supervision for mobility/OOB    Equipment Recommendations  Other (comment) (TBA)    Recommendations for Other Services       Precautions / Restrictions Precautions Precautions: Fall Restrictions Weight Bearing Restrictions: No      Mobility  Bed Mobility Overal bed mobility: Needs Assistance Bed Mobility: Supine to Sit     Supine to sit: Supervision;HOB elevated     General bed mobility comments: Patient in chair upon PT arrival  Transfers Overall transfer level: Needs assistance Equipment used: Rolling walker (2 wheeled) Transfers: Sit to/from [facility] Sit to Stand: Supervision Stand pivot transfers: Min guard       General transfer comment: Patient able to stand without physical assistance but plopped onto [facility]  and later back onto recliner. Requires assistance for safe descent into chair as she cannot control on her own.  Ambulation/Gait Ambulation/Gait assistance: Min assist;Min guard Ambulation Distance (Feet): 60 Feet Assistive device:  Rolling walker (2 wheeled) Gait Pattern/deviations: Step-through pattern;Decreased stride length;Shuffle;Trunk flexed;Drifts right/left Gait velocity: Decreased Gait velocity interpretation: Below normal speed for age/gender General Gait Details: Patient steady with use of RW. Further ambulation was deferred due to increased HR. Stairs were also deferred at this time. Patient able to stop and start but had multiple episodes of drifting to the R that required min A to redirect.  Stairs            Wheelchair Mobility    Modified Rankin (Stroke Patients Only)       Balance Overall balance assessment: Needs assistance Sitting-balance support: No upper extremity supported;Feet supported Sitting balance-Leahy Scale: Fair     Standing balance support: Bilateral upper extremity supported Standing balance-Leahy Scale: Fair Standing balance comment: Requires use of RW.                             Pertinent Vitals/Pain Pain Assessment: No/denies pain  O2 sats on RA prior to ambulation = 98-100% O2 Sats on 2L during ambulation = >91% throughout O2 sats on 1L after ambulation = 98% HR during standing = 108-111 bpm HR during ambulation = 120-127 bpm    Home Living Family/patient expects to be discharged to:: Private residence Living Arrangements: Other relatives (Older sister) Available Help at Discharge: Family;Friend(s);Available 24 hours/day Type of Home: House Home Access: Stairs to enter Entrance Stairs-Rails: Right Entrance Stairs-Number of Steps: 8 steps from basement, 2 steps from front Home Layout: One level;Laundry or work area in basement;Able to live on main level with bedroom/bathroom Home Equipment: Environmental consultant - 4 wheels Additional Comments: Family friends who are available throughout the day to assist with driving, groceries, laundry, etc.  Does most of cooking with sister. Has Rollator but hasn't used since prior hospitalization and rehab 2 years ago.     Prior Function Level of Independence: Independent               Hand Dominance   Dominant Hand: Right    Extremity/Trunk Assessment   Upper Extremity Assessment: Overall WFL for tasks assessed           Lower Extremity Assessment: Generalized weakness (With gait)      Cervical / Trunk Assessment: Kyphotic  Communication   Communication: HOH  Cognition Arousal/Alertness: Awake/alert Behavior During Therapy: WFL for tasks assessed/performed Overall Cognitive Status: Within Functional Limits for tasks assessed       Memory: Decreased short-term memory              General Comments      Exercises General Exercises - Lower Extremity Long Arc Quad: AROM;Both;20 reps;Seated Hip Flexion/Marching: AROM;Both;20 reps;Seated      Assessment/Plan    PT Assessment Patient needs continued PT services  PT Diagnosis Difficulty walking;Generalized weakness   PT Problem List Decreased strength;Decreased activity tolerance;Decreased balance;Decreased mobility;Cardiopulmonary status limiting activity  PT Treatment Interventions DME instruction;Gait training;Stair training;Functional mobility training;Therapeutic activities;Therapeutic exercise;Balance training;Patient/family education   PT Goals (Current goals can be found in the Care Plan section) Acute Rehab PT Goals Patient Stated Goal: To go home PT Goal Formulation: With patient Time For Goal Achievement: 01/01/15 Potential to Achieve Goals: Good    Frequency Min 3X/week   Barriers to discharge Inaccessible home environment;Decreased caregiver support Needs to ascend multiple steps to enter house, has family friends available throughout the day but at this time needs assistance for all OOB/mobility.    Co-evaluation               End of Session Equipment Utilized During Treatment: Gait belt;Oxygen (2L O2 via [place] for ambulation.) Activity Tolerance: Patient tolerated treatment well;Treatment limited  secondary to medical complications (Comment) (Increased HR response) Patient left: in chair;with call bell/phone within reach;with nursing/sitter in room;with family/visitor present Nurse Communication: Mobility status         Time: 0932-6712 PT Time Calculation (min) (ACUTE ONLY): 35 min   Charges:   PT Evaluation $Initial PT Evaluation Tier I: 1 Procedure PT Treatments $Gait Training: 8-22 mins   PT G CodesRoanna Epley, SPT [phone] 12/18/2014, 1:05 PM  I have read, reviewed and agree with student's note.   [place] [phone] (pager)

## 2014-12-18 NOTE — Progress Notes (Addendum)
Daily Rounding Note  12/18/2014, 11:05 AM  LOS: 3 days   SUBJECTIVE:       No bowel movements. Just had her first solid breakfast this morning. Denies any complaints  OBJECTIVE:         Vital signs in last 24 hours:    Temp:  [97.9 F (36.6 C)-99.7 F (37.6 C)] 98.1 F (36.7 C) (09/16 0800) Pulse Rate:  [52-87] 62 (09/16 0800) Resp:  [14-23] 17 (09/16 0800) BP: (91-136)/(37-59) 136/54 mmHg (09/16 0800) SpO2:  [94 %-100 %] 96 % (09/16 0610) Weight:  [109 lb 9.1 oz (49.7 kg)] 109 lb 9.1 oz (49.7 kg) (09/16 0522) Last BM Date: 12/17/14 Filed Weights   12/15/14 0244 12/18/14 0522  Weight: 117 lb 8.1 oz (53.3 kg) 109 lb 9.1 oz (49.7 kg)   General: Pleasant, aged, comfortable   Heart: RRR Chest: Clear bilaterally. No cough or labored breathing. Abdomen: Soft, nontender, not distended. Bowel sounds active.  Extremities: No CCE. Neuro/Psych:  Patient is pleasant, cooperative and follows commands but she is not oriented other than to herself.  Intake/Output from previous day: 09/15 0701 - 09/16 0700 In: 1025 [P.O.:275; IV Piggyback:750] Out: 1000 [Urine:1000]  Intake/Output this shift: Total I/O In: 240 [P.O.:240] Out: -   Lab Results:  Recent Labs  12/15/14 1542 12/16/14 0230 12/17/14 0258  WBC  --  7.5 10.4  HGB 8.7* 9.5* 11.8*  HCT 27.5* 29.7* 36.0  PLT  --  120* 173   BMET No results for input(s): NA, K, CL, CO2, GLUCOSE, BUN, CREATININE, CALCIUM in the last 72 hours. LFT No results for input(s): PROT, ALBUMIN, AST, ALT, ALKPHOS, BILITOT, BILIDIR, IBILI in the last 72 hours. PT/INR No results for input(s): LABPROT, INR in the last 72 hours. Hepatitis Panel No results for input(s): HEPBSAG, HCVAB, HEPAIGM, HEPBIGM in the last 72 hours.  Studies/Results: Dg Abd Portable 1v  12/17/2014   CLINICAL DATA:  NG tube placement.  EXAM: PORTABLE ABDOMEN - 1 VIEW  COMPARISON:  One day prior  FINDINGS:  Nasogastric tube is looped in the stomach, with the tip at day gastric body. Cardiomegaly. Convex right thoracolumbar spine curvature. Non-obstructive bowel gas pattern. Pelvis excluded. Advanced aortic atherosclerosis.  IMPRESSION: Nasogastric terminating at the body of the stomach.   Electronically Signed   By: Abigail Miyamoto M.D.   On: 12/17/2014 08:08   Dg Abd Portable 1v  12/16/2014   CLINICAL DATA:  Nasogastric tube placement.  Rectal bleeding.  EXAM: PORTABLE ABDOMEN - 1 VIEW  COMPARISON:  None.  FINDINGS: Nasogastric tube is seen with tip overlying the body the stomach. No evidence of dilated bowel loops or bowel obstruction. Atherosclerotic calcification of abdominal aorta and iliac arteries noted  IMPRESSION: Nasogastric tube tip overlies the body of the stomach.   Electronically Signed   By: Earle Gell M.D.   On: 12/16/2014 20:40    ASSESMENT:   *  Painless hematochezia.  Presumed diverticular bleed versus hemorrhoidal bleeding ( less likely) Colonoscopy 12/17/14. Sided diverticulosis, large internal hemorrhoids. cecal polyp, not removed  *  Normocytic anemia, baseline hemoglobin unknown. The hemoglobin has improved markedly, increased by over 3 g without requirement for any blood transfusion.  *  Chronic Coumadin for history of PE. INR reversed with vitamin K and FFP. Per Dr. Doyne Keel note yesterday, okay to restart anticoagulation.  However the patient's daughter is not aware of a history of PE, A. fib, stroke or DVT and  wishes patient not to restart Coumadin until they return to the patient's primary care provider to discuss necessity of Coumadin therapy in this aged patient with recent diverticular bleed  *  Dementia   PLAN   *  GI will sign off. There is no need for GI follow-up.      Azucena Freed  12/18/2014, 11:05 AM Pager: [phone]  Attending Addendum:  I agree with the Advanced Practitioner's note and impression. Likely diverticular bleed. No further bleeding. H/H  stable. Regular diet and ok to go home from my perspective. Regarding her anticoagulation, given she has stopped bleeding, if it is decided she needs to be on it, okay to resume it. However, per family discussion with PCM they are in favor of holding it. I explained to them she is at risk for future diverticular bleeding after having had this episode. If it occurs again may consider tagged RBC scan to localize the source, although suspect left colon. Of note, she had a polyp in the cecum noted that was < 1cm in size and not removed given her bleeding. I discussed this with patient and family, low likelyhood of this causing an issue for the patient during her lifetime. They are not interested in polypectomy at this time but can follow up as needed if they change their mind.   Dana Cellar, MD [facility] Pager [phone]

## 2014-12-18 NOTE — Evaluation (Addendum)
Occupational Therapy Evaluation Patient Details Name: Desiree Robertson MRN: [id number] DOB: June 16, 1925 Today's Date: 12/18/2014    History of Present Illness Desiree Robertson is a 81 y.o. female with Past medical history of hypertension, dementia, pulmonary embolism on chronic anticoagulation, tremors admitted with rectal bleeding with colonoscopy showing no active bleeding. Hgb on admission 8.6 now 11.8.   Clinical Impression   This 81 yo female admitted with above presents to acute OT at a min guard A-setup/S level and has S at home. Feel pt would continue to benefit from acute OT while she is here and post D/C HHOT to get back to a total S-Mod I level in her own environment.     Follow Up Recommendations  SNF    Equipment Recommendations  None recommended by OT       Precautions / Restrictions Precautions Precautions: Fall Restrictions Weight Bearing Restrictions: No      Mobility Bed Mobility Overal bed mobility: Needs Assistance Bed Mobility: Supine to Sit     Supine to sit: Supervision;HOB elevated     General bed mobility comments: Patient in chair upon PT arrival  Transfers Overall transfer level: Needs assistance Equipment used: Rolling walker (2 wheeled) Transfers: Sit to/from [facility] Sit to Stand: Supervision Stand pivot transfers: Min guard       General transfer comment: Patient able to stand without physical assistance but plopped onto [facility] and later back onto recliner. Requires assistance for safe descent into chair as she cannot control on her own.    Balance Overall balance assessment: Needs assistance Sitting-balance support: No upper extremity supported;Feet supported Sitting balance-Leahy Scale: Fair     Standing balance support: Bilateral upper extremity supported Standing balance-Leahy Scale: Fair Standing balance comment: Requires use of RW.                            ADL Overall ADL's : Needs  assistance/impaired Eating/Feeding: Independent;Sitting   Grooming: Set up;Supervision/safety;Sitting   Upper Body Bathing: Supervision/ safety;Set up;Sitting   Lower Body Bathing: Supervison/ safety;Set up (with min guard A sit<>stand)   Upper Body Dressing : Supervision/safety;Set up;Sitting   Lower Body Dressing: Supervision/safety;Set up (with min guard A sit<>stand)   Toilet Transfer: Min guard;Ambulation (bed>recliner)   Toileting- Clothing Manipulation and Hygiene: Min guard;Sit to/from stand               Vision Additional Comments: No change from baseline          Pertinent Vitals/Pain Pain Assessment: No/denies pain     Hand Dominance Right   Extremity/Trunk Assessment Upper Extremity Assessment Upper Extremity Assessment: Overall WFL for tasks assessed   Lower Extremity Assessment Lower Extremity Assessment: Generalized weakness (With gait)   Cervical / Trunk Assessment Cervical / Trunk Assessment: Kyphotic   Communication Communication Communication: HOH   Cognition Arousal/Alertness: Awake/alert Behavior During Therapy: WFL for tasks assessed/performed Overall Cognitive Status: Within Functional Limits for tasks assessed       Memory: Decreased short-term memory                        Home Living Family/patient expects to be discharged to:: Private residence Living Arrangements: Other relatives (Older sister) Available Help at Discharge: Family;Friend(s);Available 24 hours/day Type of Home: House Home Access: Stairs to enter [employer] of Steps: 8 steps from basement, 2 steps from front Entrance Stairs-Rails: Right Home Layout: One level;Laundry or work area in basement;Able to  live on main level with bedroom/bathroom     Bathroom Shower/Tub: Tub/shower unit;Curtain Shower/tub characteristics: Architectural technologist: Standard     Home Equipment: Environmental consultant - 4 wheels   Additional Comments: Family friends who are  available throughout the day to assist with driving, groceries, laundry, etc. Does most of cooking with sister. Has Rollator but hasn't used since prior hospitalization and rehab 2 years ago.      Prior Functioning/Environment Level of Independence: Independent             OT Diagnosis: Generalized weakness   OT Problem List: Decreased strength;Impaired balance (sitting and/or standing) (pre-existing memory issues)   OT Treatment/Interventions: Self-care/ADL training;Patient/family education;Balance training;DME and/or AE instruction    OT Goals(Current goals can be found in the care plan section) Acute Rehab OT Goals Patient Stated Goal: To go home OT Goal Formulation: With patient Time For Goal Achievement: 12/25/14 Potential to Achieve Goals: Good ADL Goals Pt Will Perform Grooming: with supervision;standing Pt Will Perform Upper Body Bathing: with supervision;standing;sitting Pt Will Perform Lower Body Bathing: with supervision;sit to/from stand Pt Will Perform Upper Body Dressing: with supervision;standing;sitting Pt Will Perform Lower Body Dressing: with supervision;sit to/from stand Pt Will Transfer to Toilet: with supervision;ambulating;bedside commode (over toilet) Pt Will Perform Toileting - Clothing Manipulation and hygiene: with supervision;sit to/from stand  OT Frequency: Min 2X/week              End of Session Nurse Communication: Mobility status  Activity Tolerance: Patient tolerated treatment well Patient left: in chair;with call bell/phone within reach;with chair alarm set   Time: 9357-0177 OT Time Calculation (min): 20 min Charges:  OT General Charges $OT Visit: 1 Procedure OT Evaluation $Initial OT Evaluation Tier I: 1 Procedure  Almon Register [id number] 12/18/2014, 11:10 AM

## 2014-12-18 NOTE — Care Management Important Message (Signed)
Important Message  Patient Details  Name: Desiree Robertson MRN: [id number] Date of Birth: 09/11/1925   Medicare Important Message Given:  Yes-second notification given    Nathen May 12/18/2014, 11:06 AM

## 2014-12-18 NOTE — Care Management Note (Addendum)
Case Management Note  Patient Details  Name: [name] MRN: [id number] Date of Birth: [birth year]  Subjective/Objective:       Pt lives with elderly sister and PT/OT recommend SNF.  Per dtr and son, pt prefers to d/c home with home health PT/OT and they have arranged caregiver.  Provided list of home health agencies and referral made to [facility] per choice.                    Expected Discharge Plan:  [place]  Discharge planning Services  CM Consult  Post Acute Care Choice:    Choice offered to:  Adult Children  HH Arranged:  PT, OT HH Agency:  [facility]  Status of Service:  Completed, signed off  Medicare Important Message Given:  Yes-second notification given  Girard Cooter, RN 12/18/2014, 2:26 PM  Pt's PCP is [facility] in [place], [place].  Telephone [phone].  Fax [phone].  Documentation faxed to [facility] and to Dr Lars Pinks.

## 2014-12-19 LAB — CBC
HCT: 30.4 % — ABNORMAL LOW (ref 36.0–46.0)
Hemoglobin: 9.8 g/dL — ABNORMAL LOW (ref 12.0–15.0)
MCH: 30.9 pg (ref 26.0–34.0)
MCHC: 32.2 g/dL (ref 30.0–36.0)
MCV: 95.9 fL (ref 78.0–100.0)
PLATELETS: 174 10*3/uL (ref 150–400)
RBC: 3.17 MIL/uL — ABNORMAL LOW (ref 3.87–5.11)
RDW: 14.4 % (ref 11.5–15.5)
WBC: 7.8 10*3/uL (ref 4.0–10.5)

## 2014-12-19 NOTE — Discharge Summary (Addendum)
Physician Discharge Summary  Desiree Robertson RXV:[id number] DOB: [birth year] DOA: 12/15/2014  PCP: No PCP Per Patient  Admit date: 12/15/2014 Discharge date: 12/19/2014  Time spent: > 35 minutes  Recommendations for Outpatient Follow-up:  1. Reassess cbc levels  Discharge Diagnoses:  Principal Problem:   Rectal bleed Active Problems:   Hypertension   Dementia   Tremors of nervous system   Pulmonary embolism   Current use of long term anticoagulation   Delirium due to medical condition without behavioral disturbance   Discharge Condition: stable  Diet recommendation: dysphagia 2 diet  Filed Weights   12/15/14 0244 12/18/14 0522  Weight: 53.3 kg (117 lb 8.1 oz) 49.7 kg (109 lb 9.1 oz)    History of present illness:  81 y/o CF with history of dementia, htn, h/o PE on chronic anticoagulation medication who presented with complaints of lower GI bleed.  Hospital Course:  Lower GI bleed - No active bleeding on colonoscopy noted. Please review results for more details.  - Given that GI specialist did not note any active bleeding recommended continuing anticoagulation. Discussed with family who does not want to continue anticoagulation medication at this juncture. - GI on board while patient in house  History of PE - Will discontinue Coumadin as per discussion with son and patient. They are aware of risks associated with discontinuing Coumadin as well as continuing Coumadin  Dementia: addendum: without behavioral disturbance -Continue Aricept and Namenda. Stable  Essential hypertension - We'll continue metoprolol  Procedures:  Colonoscopy  Consultations:  Dr. Renelda Loma Armbruster  Discharge Exam: Filed Vitals:   12/19/14 1255  BP: 133/69  Pulse: 92  Temp: 98.5 F (36.9 C)  Resp: 16    General: Pt in nad, alert and awake Cardiovascular: rrr, no mrg Respiratory: cta bl, no wheezes  Discharge Instructions   Discharge Instructions    Call MD for:  redness,  tenderness, or signs of infection (pain, swelling, redness, odor or green/yellow discharge around incision site)    Complete by:  As directed      Call MD for:  severe uncontrolled pain    Complete by:  As directed      Call MD for:  temperature >100.4    Complete by:  As directed      Diet - low sodium heart healthy    Complete by:  As directed      Discharge instructions    Complete by:  As directed   Discharge home with home health. Pt will go home off anticoagulation as per my discussion with son at bedside.     Increase activity slowly    Complete by:  As directed           Current Discharge Medication List    CONTINUE these medications which have NOT CHANGED   Details  donepezil (ARICEPT) 5 MG tablet Take 5 mg by mouth at bedtime.    ergocalciferol (VITAMIN D2) 50000 UNITS capsule Take 50,000 Units by mouth once a week.    furosemide (LASIX) 40 MG tablet Take 40 mg by mouth daily.    memantine (NAMENDA) 5 MG tablet Take 5 mg by mouth 2 (two) times daily.    metoprolol succinate (TOPROL-XL) 25 MG 24 hr tablet Take 25 mg by mouth daily.    oxybutynin (DITROPAN-XL) 10 MG 24 hr tablet Take 10 mg by mouth at bedtime.    potassium chloride SA (K-DUR,KLOR-CON) 20 MEQ tablet Take 20 mEq by mouth daily.    !! primidone (MYSOLINE)  250 MG tablet Take 250 mg by mouth at bedtime.    !! primidone (MYSOLINE) 50 MG tablet Take 50 mg by mouth daily.    simvastatin (ZOCOR) 40 MG tablet Take 40 mg by mouth daily.     !! - Potential duplicate medications found. Please discuss with provider.    STOP taking these medications     warfarin (COUMADIN) 1 MG tablet      warfarin (COUMADIN) 3 MG tablet        No Known Allergies Follow-up Information    Please follow up.   Why:  [facility]  [phone]       The results of significant diagnostics from this hospitalization (including imaging, microbiology, ancillary and laboratory) are listed below for  reference.    Significant Diagnostic Studies: Dg Abd Portable 1v  12/17/2014   CLINICAL DATA:  NG tube placement.  EXAM: PORTABLE ABDOMEN - 1 VIEW  COMPARISON:  One day prior  FINDINGS: Nasogastric tube is looped in the stomach, with the tip at day gastric body. Cardiomegaly. Convex right thoracolumbar spine curvature. Non-obstructive bowel gas pattern. Pelvis excluded. Advanced aortic atherosclerosis.  IMPRESSION: Nasogastric terminating at the body of the stomach.   Electronically Signed   By: Abigail Miyamoto M.D.   On: 12/17/2014 08:08   Dg Abd Portable 1v  12/16/2014   CLINICAL DATA:  Nasogastric tube placement.  Rectal bleeding.  EXAM: PORTABLE ABDOMEN - 1 VIEW  COMPARISON:  None.  FINDINGS: Nasogastric tube is seen with tip overlying the body the stomach. No evidence of dilated bowel loops or bowel obstruction. Atherosclerotic calcification of abdominal aorta and iliac arteries noted  IMPRESSION: Nasogastric tube tip overlies the body of the stomach.   Electronically Signed   By: Earle Gell M.D.   On: 12/16/2014 20:40    Microbiology: Recent Results (from the past 240 hour(s))  MRSA PCR Screening     Status: None   Collection Time: 12/15/14  2:36 AM  Result Value Ref Range Status   MRSA by PCR NEGATIVE NEGATIVE Final    Comment:        The GeneXpert MRSA Assay (FDA approved for NASAL specimens only), is one component of a comprehensive MRSA colonization surveillance program. It is not intended to diagnose MRSA infection nor to guide or monitor treatment for MRSA infections.      Labs: Basic Metabolic Panel:  Recent Labs Lab 12/15/14 0340  NA 142  K 3.7  CL 108  CO2 29  GLUCOSE 104*  BUN 12  CREATININE 0.56  CALCIUM 8.1*   Liver Function Tests:  Recent Labs Lab 12/15/14 0340  AST 19  ALT 15  ALKPHOS 53  BILITOT 0.5  PROT 5.7*  ALBUMIN 3.2*   No results for input(s): LIPASE, AMYLASE in the last 168 hours. No results for input(s): AMMONIA in the last 168  hours. CBC:  Recent Labs Lab 12/15/14 0340 12/15/14 1000 12/15/14 1542 12/16/14 0230 12/17/14 0258 12/19/14 1206  WBC 7.1  --   --  7.5 10.4 7.8  NEUTROABS 4.9  --   --   --   --   --   HGB 8.6* 9.0* 8.7* 9.5* 11.8* 9.8*  HCT 27.2* 28.0* 27.5* 29.7* 36.0 30.4*  MCV 97.1  --   --  96.7 94.5 95.9  PLT 104*  --   --  120* 173 174   Cardiac Enzymes: No results for input(s): CKTOTAL, CKMB, CKMBINDEX, TROPONINI in the last 168 hours. BNP: BNP (  last 3 results) No results for input(s): BNP in the last 8760 hours.  ProBNP (last 3 results) No results for input(s): PROBNP in the last 8760 hours.  CBG:  Recent Labs Lab 12/15/14 1748  GLUCAP 74       Signed:  Velvet Bathe  [facility] 12/19/2014, 2:21 PM

## 2016-02-09 IMAGING — CR DG ABD PORTABLE 1V
1 series · 1 of 1 positions shown · non-contrast
Comparison: None.

CLINICAL DATA: Nasogastric tube placement.  Rectal bleeding.

EXAM:
PORTABLE ABDOMEN - 1 VIEW

[AP]
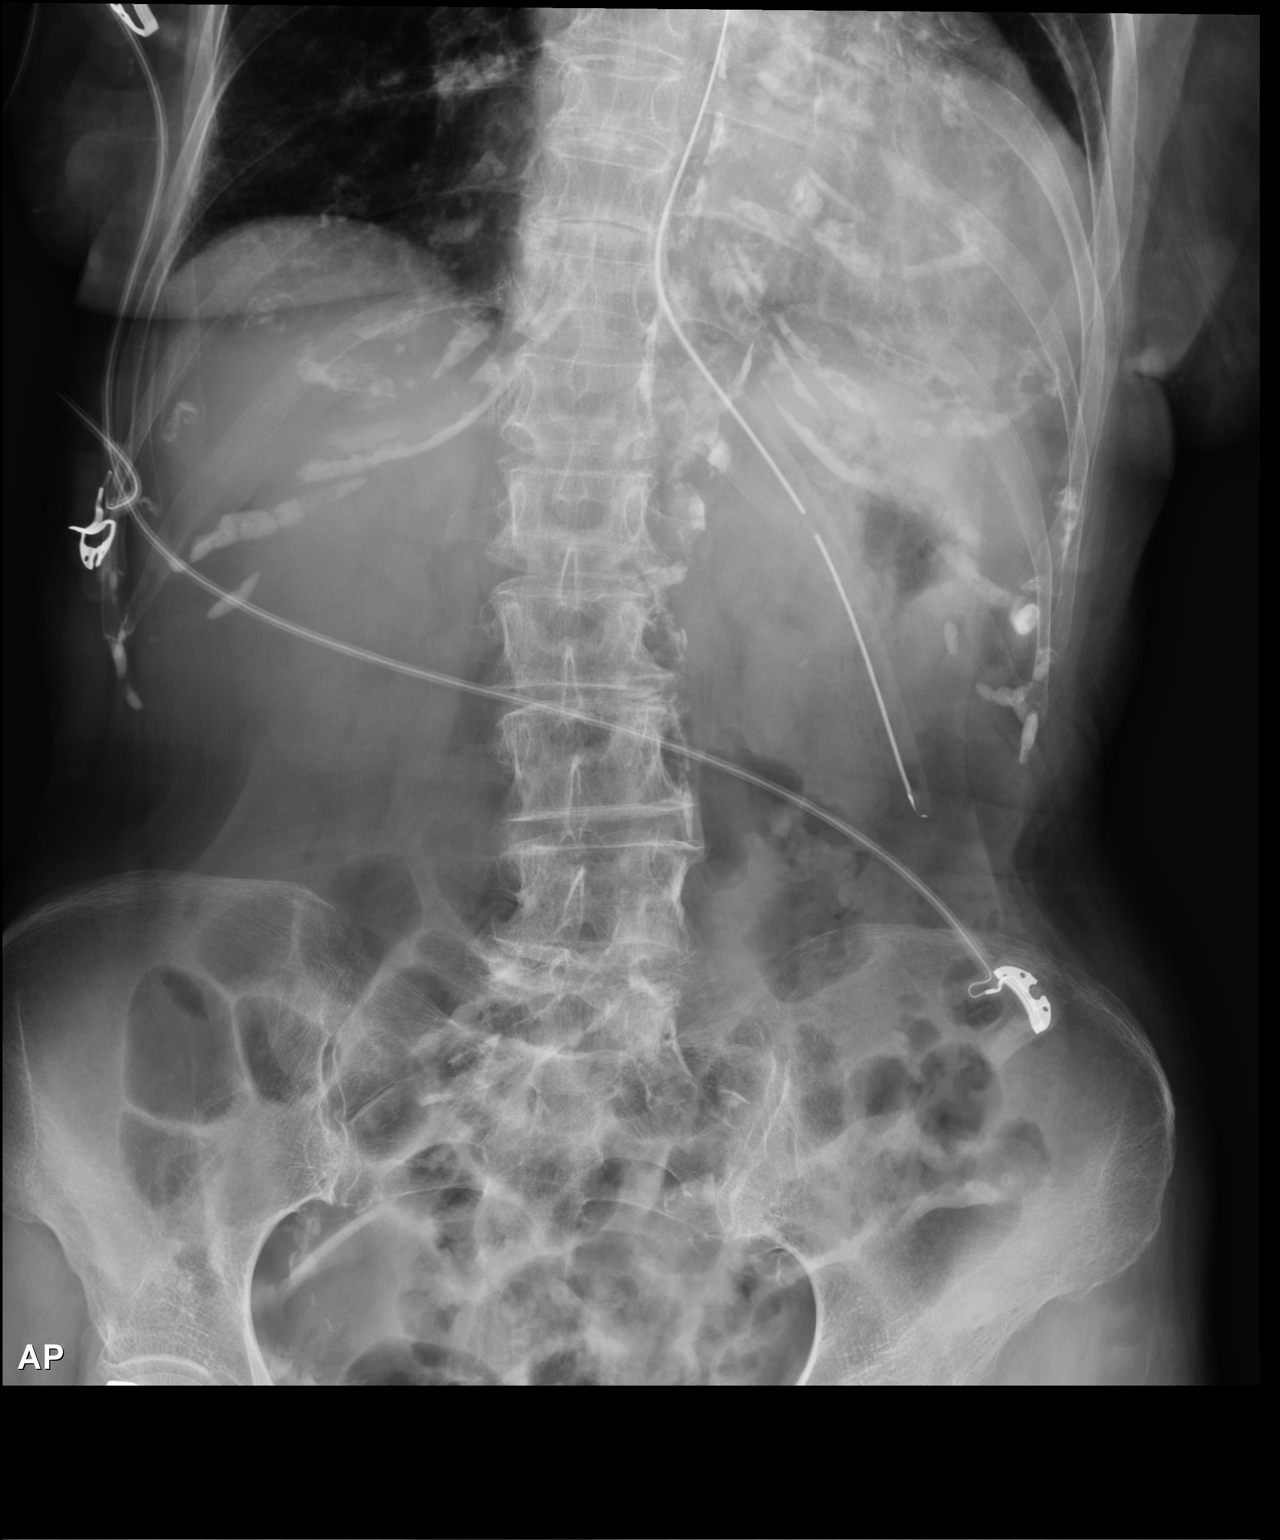

[1 of 1 positions shown; findings below may reference images not displayed]

FINDINGS: Nasogastric tube is seen with tip overlying the body the stomach. No
evidence of dilated bowel loops or bowel obstruction.
Atherosclerotic calcification of abdominal aorta and iliac arteries
noted
IMPRESSION: Nasogastric tube tip overlies the body of the stomach.

## 2019-01-02 DEATH — deceased
